# Patient Record
Sex: Male | Born: 1965 | Race: Black or African American | Hispanic: No | Marital: Married | Smoking: Never smoker
Health system: Southern US, Community
[De-identification: ages and names within clinical notes are randomized; demographics above are authoritative.]

## PROBLEM LIST (undated history)

## (undated) DIAGNOSIS — E119 Type 2 diabetes mellitus without complications: Secondary | ICD-10-CM

## (undated) DIAGNOSIS — I10 Essential (primary) hypertension: Secondary | ICD-10-CM

## (undated) HISTORY — DX: Type 2 diabetes mellitus without complications: E11.9

---

## 2007-04-08 ENCOUNTER — Ambulatory Visit: Payer: Self-pay | Admitting: Family Medicine

## 2007-04-10 ENCOUNTER — Ambulatory Visit (HOSPITAL_COMMUNITY): Admission: RE | Admit: 2007-04-10 | Discharge: 2007-04-10 | Payer: Self-pay | Admitting: Family Medicine

## 2008-05-24 IMAGING — CR DG ABDOMEN 2V
2 series · 2 of 2 positions shown · non-contrast
Comparison: None.

CLINICAL DATA: Bilateral lower abdominal pain for the past 2 weeks.

CHEST - 2 VIEW

[w abdomen upright]
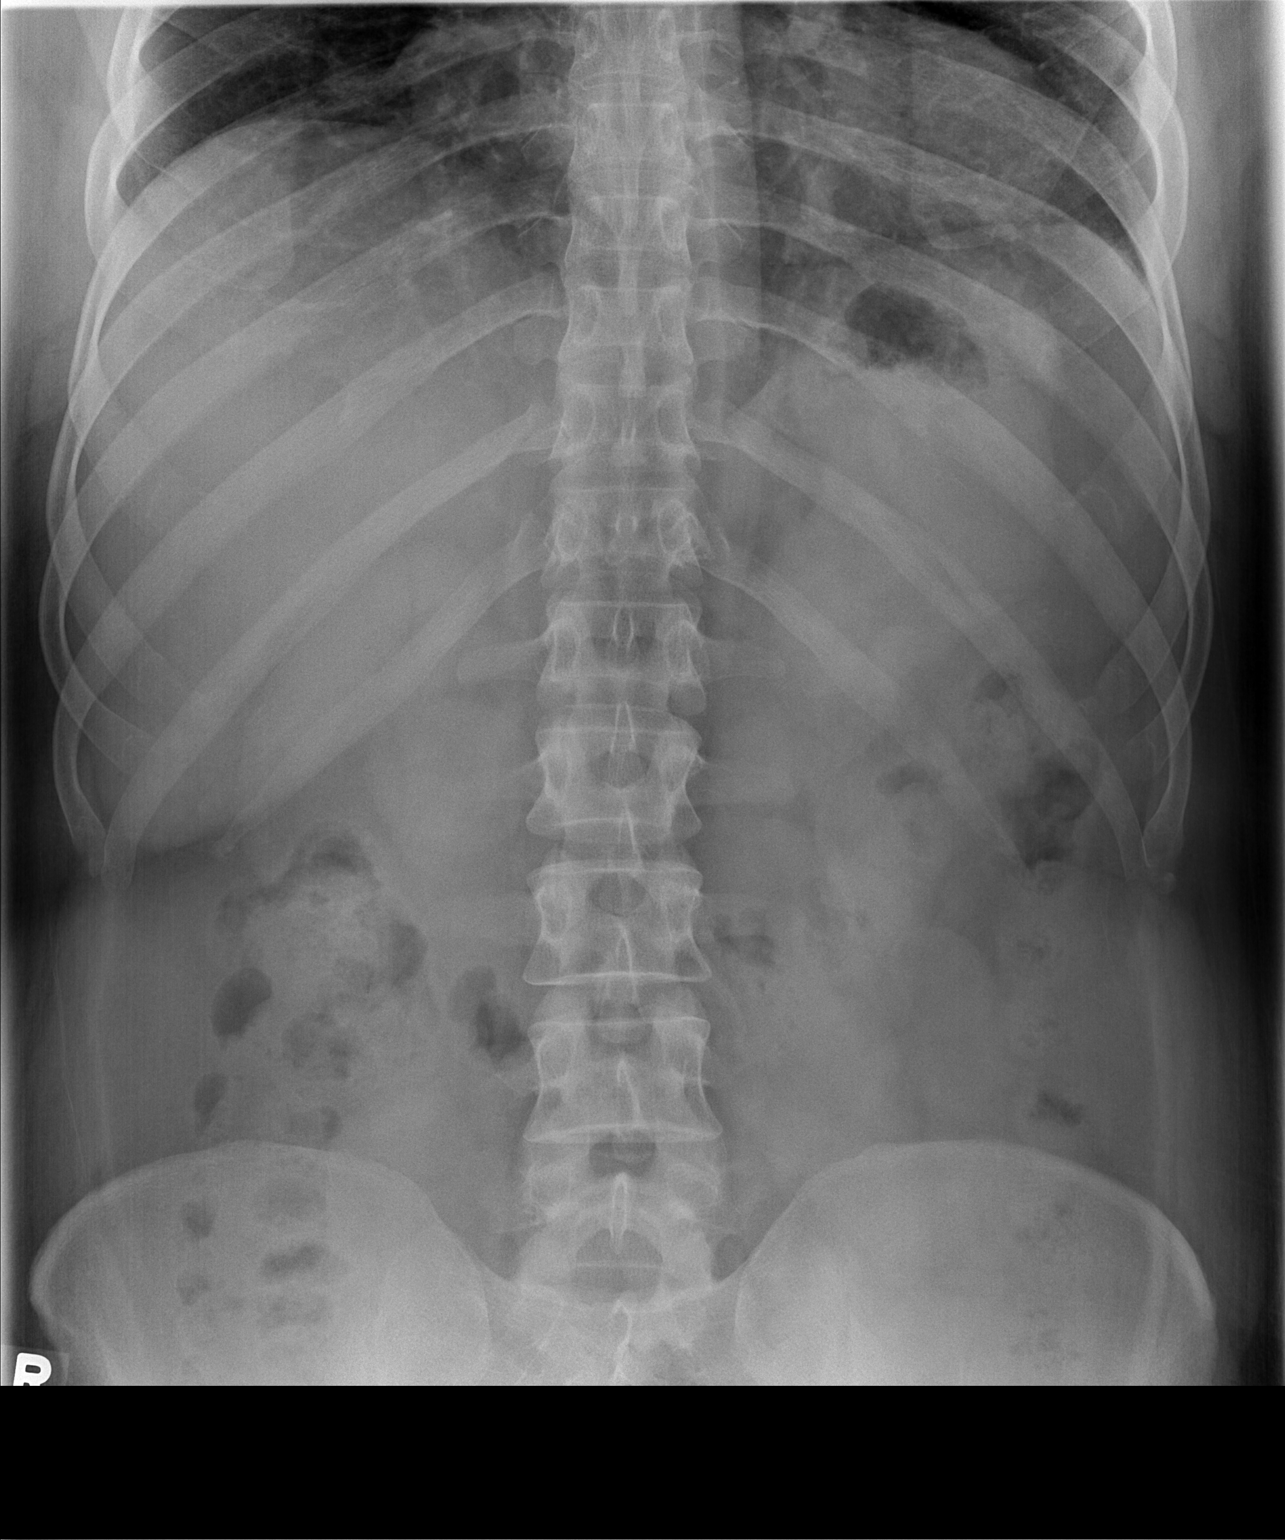

[t abdomen supine]
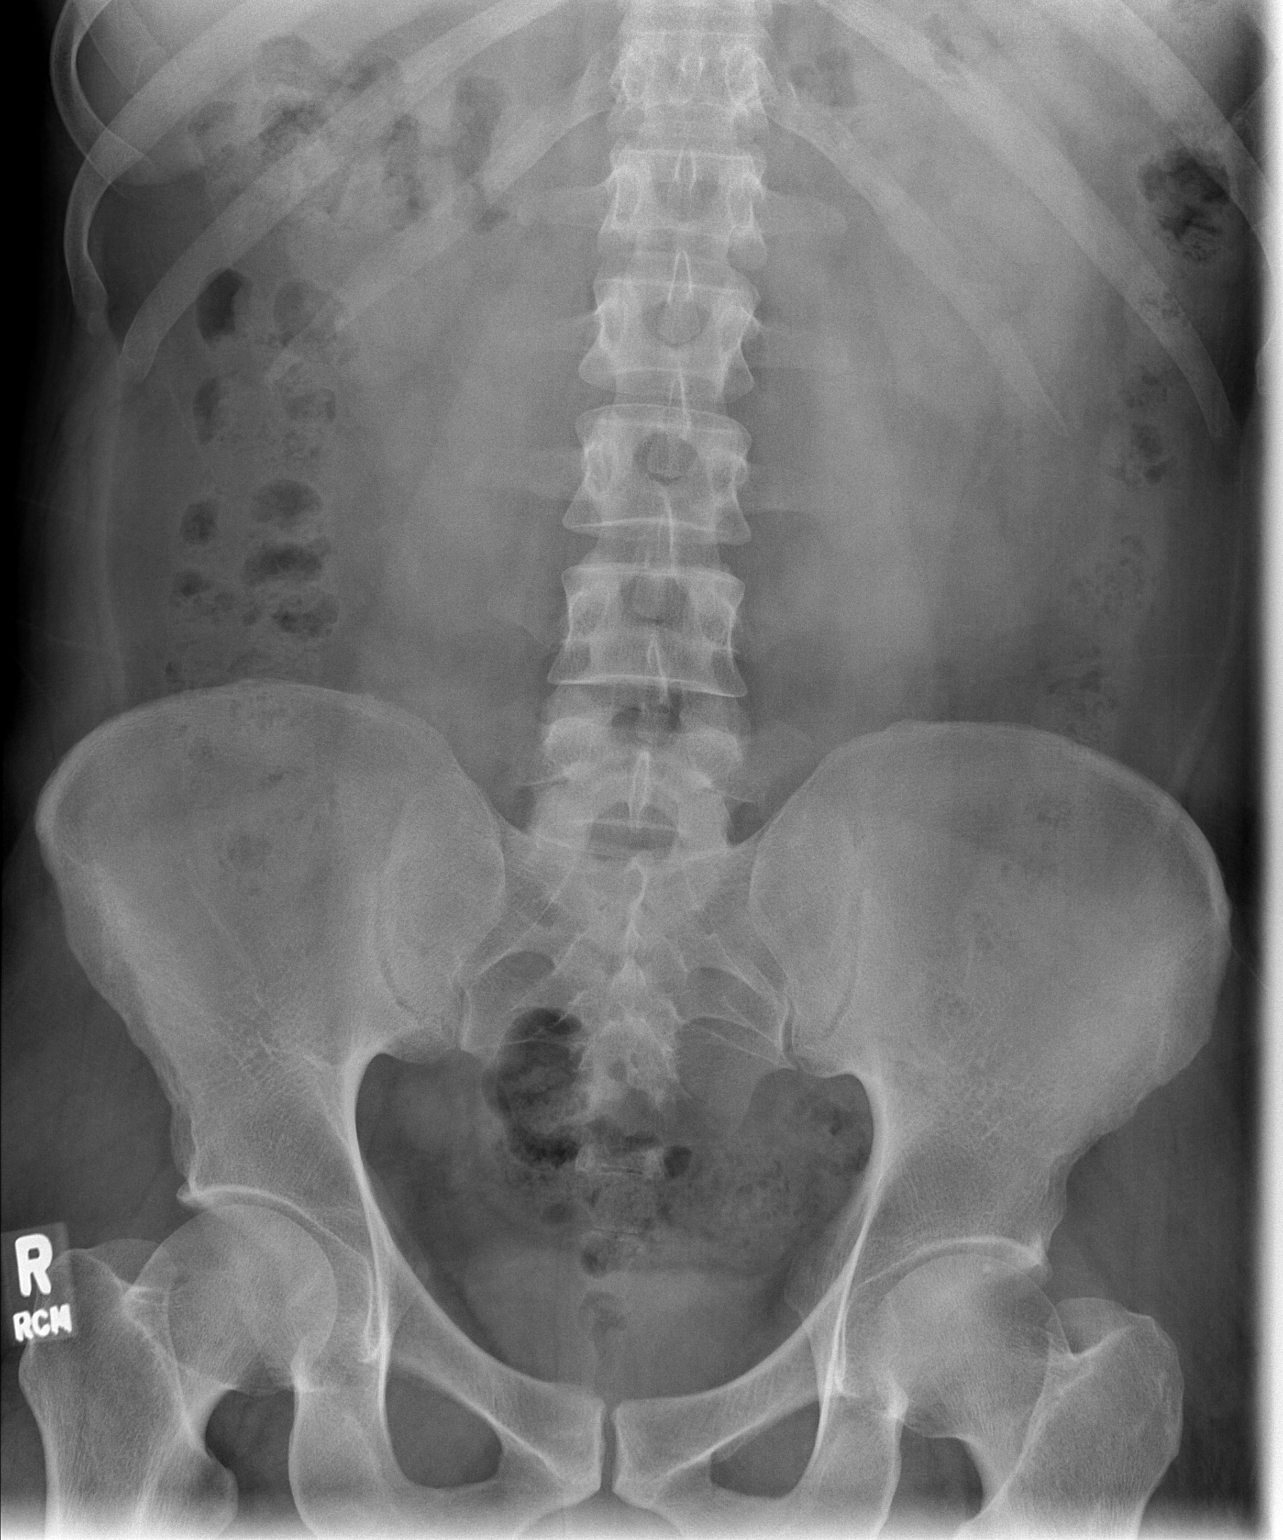

[2 of 2 positions shown; findings below may reference images not displayed]

FINDINGS: Normal bowel gas pattern with mildly prominent stool throughout the
colon. Unremarkable bones.

IMPRESSION

Mildly prominent stool throughout the colon.

## 2009-09-18 ENCOUNTER — Ambulatory Visit: Payer: Self-pay | Admitting: Family Medicine

## 2012-02-16 ENCOUNTER — Other Ambulatory Visit (HOSPITAL_COMMUNITY): Payer: Self-pay | Admitting: Family Medicine

## 2012-02-16 ENCOUNTER — Ambulatory Visit (HOSPITAL_COMMUNITY)
Admission: RE | Admit: 2012-02-16 | Discharge: 2012-02-16 | Disposition: A | Discharge status: Home / Self Care | Payer: Self-pay | Source: Ambulatory Visit | Attending: Family Medicine | Admitting: Family Medicine

## 2012-02-16 DIAGNOSIS — R52 Pain, unspecified: Secondary | ICD-10-CM

## 2012-02-16 DIAGNOSIS — R062 Wheezing: Secondary | ICD-10-CM | POA: Insufficient documentation

## 2012-02-16 DIAGNOSIS — R0602 Shortness of breath: Secondary | ICD-10-CM | POA: Insufficient documentation

## 2012-02-16 DIAGNOSIS — R079 Chest pain, unspecified: Secondary | ICD-10-CM | POA: Insufficient documentation

## 2014-08-15 ENCOUNTER — Ambulatory Visit (INDEPENDENT_AMBULATORY_CARE_PROVIDER_SITE_OTHER): Payer: Self-pay | Admitting: Family Medicine

## 2014-08-15 VITALS — BP 112/80 | HR 71 | Temp 98.2°F | Resp 16 | Ht 68.0 in | Wt 177.0 lb

## 2014-08-15 DIAGNOSIS — Z0289 Encounter for other administrative examinations: Secondary | ICD-10-CM

## 2014-08-15 DIAGNOSIS — Z008 Encounter for other general examination: Secondary | ICD-10-CM

## 2014-08-17 NOTE — Progress Notes (Signed)
Airline pilotCommercial Driver Medical Examination   Peter Ferrell- Peter ReveringSalih is a 48 y.o. male who presents today for a commercial driver fitness determination physical exam. The patient reports no problems. The following portions of the patient's history were reviewed and updated as appropriate: allergies, current medications, past family history, past medical history, past social history, past surgical history and problem list. Review of Systems A comprehensive review of systems was negative.   Objective:    Vision:  Uncorrected Corrected Horizontal Field of Vision  Right Eye 20/15 na 85 degrees  Left Eye  20/20 na 85 degrees  Both Eyes  20/15 na    Applicant can recognize and distinguish among traffic control signals and devices showing standard red, green, and amber colors.     Monocular Vision?: No   Hearing: Can hear forced whisper from 10 feet bilaterally     BP 112/80 mmHg  Pulse 71  Temp(Src) 98.2 F (36.8 C) (Oral)  Resp 16  Ht 5\' 8"  (1.727 m)  Wt 177 lb (80.287 kg)  BMI 26.92 kg/m2  SpO2 98%  General Appearance:    Alert, cooperative, no distress, appears stated age  Head:    Normocephalic, without obvious abnormality, atraumatic  Eyes:    PERRL, conjunctiva/corneas clear, EOM's intact, fundi    benign, both eyes       Ears:    Normal TM's and external ear canals, both ears  Nose:   Nares normal, septum midline, mucosa normal, no drainage    or sinus tenderness  Throat:   Lips, mucosa, and tongue normal; teeth and gums normal  Neck:   Supple, symmetrical, trachea midline, no adenopathy;       thyroid:  No enlargement/tenderness/nodules; no carotid   bruit or JVD  Back:     Symmetric, no curvature, ROM normal, no CVA tenderness  Lungs:     Clear to auscultation bilaterally, respirations unlabored  Chest wall:    No tenderness or deformity  Heart:    Regular rate and rhythm, S1 and S2 normal, no murmur, rub   or gallop  Abdomen:     Soft, non-tender, bowel sounds active all  four quadrants,    no masses, no organomegaly  Genitalia:    Normal male without lesion, discharge or tenderness     Extremities:   Extremities normal, atraumatic, no cyanosis or edema  Pulses:   2+ and symmetric all extremities  Skin:   Skin color, texture, turgor normal, no rashes or lesions  Lymph nodes:   Cervical, supraclavicular, and axillary nodes normal  Neurologic:   CNII-XII intact. Normal strength, sensation and reflexes      throughout    Labs: No results found for: SPECGRAV, PROTEINUR, BILIRUBINUR, GLUCOSEU   GS 1.020, 30+prot, neg bld, neg glucose  Assessment:    Healthy male exam.  Meets standards in 6349 CFR 391.41;  qualifies for 2 year certificate.    Plan:    Medical examiners certificate completed and printed. Return as needed.

## 2016-07-31 ENCOUNTER — Ambulatory Visit (INDEPENDENT_AMBULATORY_CARE_PROVIDER_SITE_OTHER): Payer: Self-pay | Admitting: Physician Assistant

## 2016-07-31 VITALS — BP 122/84 | HR 72 | Temp 98.3°F | Resp 17 | Ht 68.0 in | Wt 177.0 lb

## 2016-07-31 DIAGNOSIS — E119 Type 2 diabetes mellitus without complications: Secondary | ICD-10-CM

## 2016-07-31 DIAGNOSIS — Z0289 Encounter for other administrative examinations: Secondary | ICD-10-CM

## 2016-07-31 NOTE — Progress Notes (Signed)
By signing my name below, I, [name], attest that this documentation has been prepared under the direction and in the presence of [name], PA-C.  Electronically Signed: Arvilla MarketMesha Guinyard, Medical Scribe. 07/31/16. 2:29 PM.  Airline pilotCommercial Driver Medical Examination   [name]- [name] is a 50 y.o. male who presents today for a commercial driver fitness determination physical exam. The patient reports no problems today. In the past the patient reports receiving 1 year certificates. he denies focal neurological deficits, vision and hearing changes. He denies the habitual use of benzodiazepines, opioids, amphetamines, and narcotics. Pt does not smoke tobacco.  Past Medical History:  Diagnosis Date   Diabetes mellitus without complication (HCC)    DM: Takes metformin 500 mg. Is not complaining of sock and glove paresthesia, polydipsia or nocturia.   Current medications, family history, allergies, social history reviewed by me and exist elsewhere in the encounter.   Review of Systems  HENT: Negative for hearing loss.   Eyes: Negative for blurred vision and double vision.  Respiratory: Negative for shortness of breath.   Gastrointestinal: Negative for abdominal pain.  Neurological: Negative for focal weakness and seizures.  Psychiatric/Behavioral: Negative for depression. The patient is not nervous/anxious.     Objective:  BP 122/84 (BP Location: Right Arm, Patient Position: Sitting, Cuff Size: Normal)    Pulse 72    Temp 98.3 F (36.8 C) (Oral)    Resp 17    Ht 5\' 8"  (1.727 m)    Wt 177 lb (80.3 kg)    SpO2 99%    BMI 26.91 kg/m   Vision/hearing:  Visual Acuity Screening   Right eye Left eye Both eyes  Without correction: 20/20 20/20 20/15   With correction:       Applicant can recognize and distinguish among traffic control signals and devices showing standard red, green, and amber colors.  Corrective lenses required: No  Monocular Vision?: No  Hearing aid requirement:  No  Physical Exam  Constitutional: He appears well-developed and well-nourished. No distress.  HENT:  Head: Normocephalic and atraumatic.  Mouth/Throat: Oropharynx is clear and moist.  Dentition in good repair  Eyes: Conjunctivae are normal.  Neck: Neck supple.  Cardiovascular: Normal rate, regular rhythm and normal heart sounds.  Exam reveals no gallop and no friction rub.   No murmur heard. Pulses:      Radial pulses are 2+ on the right side, and 2+ on the left side.       Posterior tibial pulses are 2+ on the right side, and 2+ on the left side.  Radial, and PT pulses nl and intact  Pulmonary/Chest: Effort normal and breath sounds normal. No respiratory distress. He has no wheezes. He has no rales.  Abdominal: He exhibits no mass. There is no tenderness. There is no rebound.  Neurological: He is alert. He has normal strength. No cranial nerve deficit. He displays a negative Romberg sign.  Reflex Scores:      Tricep reflexes are 2+ on the right side and 2+ on the left side.      Bicep reflexes are 2+ on the right side and 2+ on the left side.      Brachioradialis reflexes are 2+ on the right side and 2+ on the left side.      Patellar reflexes are 2+ on the right side and 2+ on the left side.      Achilles reflexes are 2+ on the right side and 2+ on the left side. Skin: Skin is  warm and dry.  Psychiatric: He has a normal mood and affect. His behavior is normal.  Nursing note and vitals reviewed.   BP 122/84 (BP Location: Right Arm, Patient Position: Sitting, Cuff Size: Normal)    Pulse 72    Temp 98.3 F (36.8 C) (Oral)    Resp 17    Ht 5\' 8"  (1.727 m)    Wt 177 lb (80.3 kg)    SpO2 99%    BMI 26.91 kg/m   Labs:  [Urinalysis]: Sp Gr.: 1.025 / Protein: 100 / Blood: negative / Sugar: negative   Assessment:    Healthy male exam.  Meets standards, but periodic monitoring required due to DM.  Driver qualified only for 1 year.   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  There is no sugar in his urine.     Plan:    Medical examiners certificate completed and printed. Return as needed.     This note was scribed in my presence and I performed the services described in the this documentation.

## 2016-07-31 NOTE — Patient Instructions (Signed)
     IF you received an x-ray today, you will receive an invoice from [facility]. Please contact [facility] at [phone] with questions or concerns regarding your invoice.   IF you received labwork today, you will receive an invoice from [facility]. Please contact [facility] at [phone] with questions or concerns regarding your invoice.   Our billing staff will not be able to assist you with questions regarding bills from these companies.  You will be contacted with the lab results as soon as they are available. The fastest way to get your results is to activate your My Chart account. Instructions are located on the last page of this paperwork. If you have not heard from us regarding the results in 2 weeks, please contact this office.      

## 2017-05-20 ENCOUNTER — Ambulatory Visit (INDEPENDENT_AMBULATORY_CARE_PROVIDER_SITE_OTHER): Payer: Self-pay | Admitting: Physician Assistant

## 2017-05-26 ENCOUNTER — Ambulatory Visit (INDEPENDENT_AMBULATORY_CARE_PROVIDER_SITE_OTHER): Payer: Self-pay | Admitting: Physician Assistant

## 2022-01-27 ENCOUNTER — Encounter (HOSPITAL_COMMUNITY): Payer: Self-pay | Admitting: Emergency Medicine

## 2022-01-27 ENCOUNTER — Other Ambulatory Visit: Payer: Self-pay

## 2022-01-27 ENCOUNTER — Ambulatory Visit (HOSPITAL_COMMUNITY)
Admission: EM | Admit: 2022-01-27 | Discharge: 2022-01-27 | Disposition: A | Discharge status: Home / Self Care | Payer: 59

## 2022-01-27 DIAGNOSIS — J069 Acute upper respiratory infection, unspecified: Secondary | ICD-10-CM | POA: Diagnosis not present

## 2022-01-27 HISTORY — DX: Essential (primary) hypertension: I10

## 2022-01-27 MED ORDER — LISINOPRIL 20 MG PO TABS: 20.0000 mg | ORAL_TABLET | Freq: Every day | ORAL | 0 refills | Status: DC

## 2022-01-27 MED ORDER — METFORMIN HCL 500 MG PO TABS: 500.0000 mg | ORAL_TABLET | Freq: Two times a day (BID) | ORAL | 0 refills | Status: DC

## 2022-01-27 MED ORDER — AMOXICILLIN-POT CLAVULANATE 875-125 MG PO TABS: 1.0000 | ORAL_TABLET | Freq: Two times a day (BID) | ORAL | 0 refills | Status: DC

## 2022-01-27 NOTE — Discharge Instructions (Signed)
On exam your lungs were noted to be congested as your symptoms have been present for 7 days with no improvement, we will cover for bacteria and upper airway in the lungs ? ?Take Augmentin twice daily for the next 7 days ? ?You may continue use of any over-the-counter medications for additional comfort ?   ?You can take Tylenol and/or Ibuprofen as needed for fever reduction and pain relief. ?  ?For cough: honey 1/2 to 1 teaspoon (you can dilute the honey in water or another fluid).  You can also use guaifenesin and dextromethorphan for cough. You can use a humidifier for chest congestion and cough.  If you don't have a humidifier, you can sit in the bathroom with the hot shower running.    ?  ?For sore throat: try warm salt water gargles, cepacol lozenges, throat spray, warm tea or water with lemon/honey, popsicles or ice, or OTC cold relief medicine for throat discomfort. ?  ?For congestion: take a daily anti-histamine like Zyrtec, Claritin, and a oral decongestant, such as pseudoephedrine.  You can also use Flonase 1-2 sprays in each nostril daily. ?  ?It is important to stay hydrated: drink plenty of fluids (water, gatorade/powerade/pedialyte, juices, or teas) to keep your throat moisturized and help further relieve irritation/discomfort.  ?

## 2022-01-27 NOTE — ED Triage Notes (Signed)
Complains of cough and congestion for one week.  Patient is now noticing some wheezing in chest.  Denies fever.   ?

## 2022-01-27 NOTE — ED Provider Notes (Signed)
?[facility] ? ? ? ?CSN: [id number] ?Arrival date & time: 01/27/22  1257 ? ? ?  ? ?History   ?Chief Complaint ?Chief Complaint  ?Patient presents with  ? Cough  ? ? ?HPI ?Peter Ferrell is a 56 y.o. male.  ? ?Patient presents with chest congestion , nonproductive cough and wheezing for 7 days.  Wheezing is occurring predominantly at nighttime.  Following food and liquids.  Known sick contacts in household.  Has attempted use of allergy medicine and over-the-counter cold and flu which has been ineffective.  History of diabetes and hypertension .  Denies shortness of breath and chest tightness. ? ?Past Medical History:  ?Diagnosis Date  ? Diabetes mellitus without complication (HCC)   ? Hypertension   ? ? ?Patient Active Problem List  ? Diagnosis Date Noted  ? Diabetes (HCC) 07/31/2016  ? ? ?History reviewed. No pertinent surgical history. ? ? ? ? ?Home Medications   ? ?Prior to Admission medications   ?Medication Sig Start Date End Date Taking? Authorizing Provider  ?lisinopril (ZESTRIL) 20 MG tablet Take 20 mg by mouth daily. 10/28/21   [provider]  ?metFORMIN (GLUCOPHAGE) 500 MG tablet Take by mouth 2 (two) times daily with a meal.    [provider]  ? ? ?Family History ?History reviewed. No pertinent family history. ? ?Social History ?Social History  ? ?Tobacco Use  ? Smoking status: Never  ?Vaping Use  ? Vaping Use: Never used  ?Substance Use Topics  ? Alcohol use: Never  ? Drug use: Never  ? ? ? ?Allergies   ?Patient has no known allergies. ? ? ?Review of Systems ?Review of Systems  ?Constitutional: Negative.   ?HENT:  Positive for congestion. Negative for dental problem, drooling, ear discharge, ear pain, facial swelling, hearing loss, mouth sores, nosebleeds, postnasal drip, rhinorrhea, sinus pressure, sinus pain, sneezing, sore throat, tinnitus, trouble swallowing and voice change.   ?Respiratory:  Positive for cough and wheezing. Negative for apnea, choking, chest  tightness, shortness of breath and stridor.   ?Cardiovascular: Negative.   ?Gastrointestinal: Negative.   ?Skin: Negative.   ?Neurological: Negative.   ? ? ?Physical Exam ?Triage Vital Signs ?ED Triage Vitals  ?Enc Vitals Group  ?   BP 01/27/22 1351 (!) 149/73  ?   Pulse Rate 01/27/22 1351 74  ?   Resp 01/27/22 1351 18  ?   Temp 01/27/22 1351 98.1 ?F (36.7 ?C)  ?   Temp Source 01/27/22 1351 Oral  ?   SpO2 01/27/22 1351 97 %  ?   Weight --   ?   Height --   ?   Head Circumference --   ?   Peak Flow --   ?   Pain Score 01/27/22 1348 6  ?   Pain Loc --   ?   Pain Edu? --   ?   Excl. in GC? --   ? ?No data found. ? ?Updated Vital Signs ?BP (!) 149/73 (BP Location: Right Arm)   Pulse 74   Temp 98.1 ?F (36.7 ?C) (Oral)   Resp 18   SpO2 97%  ? ?Visual Acuity ?Right Eye Distance:   ?Left Eye Distance:   ?Bilateral Distance:   ? ?Right Eye Near:   ?Left Eye Near:    ?Bilateral Near:    ? ?Physical Exam ?Constitutional:   ?   Appearance: Normal appearance.  ?HENT:  ?   Head: Normocephalic.  ?   Right Ear: Tympanic membrane,  ear canal and external ear normal.  ?   Left Ear: Tympanic membrane, ear canal and external ear normal.  ?   Nose: Congestion present. No rhinorrhea.  ?   Mouth/Throat:  ?   Mouth: Mucous membranes are moist.  ?   Pharynx: Oropharynx is clear.  ?Eyes:  ?   Extraocular Movements: Extraocular movements intact.  ?Cardiovascular:  ?   Rate and Rhythm: Normal rate and regular rhythm.  ?   Pulses: Normal pulses.  ?   Heart sounds: Normal heart sounds.  ?Pulmonary:  ?   Effort: Pulmonary effort is normal.  ?   Breath sounds: Rhonchi present.  ?Musculoskeletal:  ?   Cervical back: Normal range of motion and neck supple.  ?Skin: ?   General: Skin is warm and dry.  ?Neurological:  ?   Mental Status: He is alert and oriented to person, place, and time. Mental status is at baseline.  ?Psychiatric:     ?   Mood and Affect: Mood normal.     ?   Behavior: Behavior normal.  ? ? ? ?UC Treatments / Results  ?Labs ?(all  labs ordered are listed, but only abnormal results are displayed) ?Labs Reviewed - No data to display ? ?EKG ? ? ?Radiology ?No results found. ? ?Procedures ?Procedures (including critical care time) ? ?Medications Ordered in UC ?Medications - No data to display ? ?Initial Impression / Assessment and Plan / UC Course  ?I have reviewed the triage vital signs and the nursing notes. ? ?Pertinent labs & imaging results that were available during my care of the patient were reviewed by me and considered in my medical decision making (see chart for details). ? ?Acute upper respiratory infection ? ?Vital signs are stable, O2 saturation 97% on room air, rhonchi heard to auscultation, will move forward for coverage for bronchitis and pneumonia without imaging, discussed with patient, Augmentin 7-day course prescribed, recommended use of over-the-counter medications for additional support, may follow-up with urgent care as needed for persisting or reoccurring symptoms ?Final Clinical Impressions(s) / UC Diagnoses  ? ?Final diagnoses:  ?None  ? ?Discharge Instructions   ?None ?  ? ?ED Prescriptions   ?None ?  ? ?PDMP not reviewed this encounter. ?  ?Valinda Hoar, NP ?01/27/22 1451 ? ?

## 2022-02-03 ENCOUNTER — Ambulatory Visit (HOSPITAL_COMMUNITY)
Admission: EM | Admit: 2022-02-03 | Discharge: 2022-02-03 | Disposition: A | Discharge status: Home / Self Care | Payer: 59 | Attending: Family Medicine | Admitting: Family Medicine

## 2022-02-03 ENCOUNTER — Ambulatory Visit (INDEPENDENT_AMBULATORY_CARE_PROVIDER_SITE_OTHER): Payer: 59

## 2022-02-03 ENCOUNTER — Encounter (HOSPITAL_COMMUNITY): Payer: Self-pay

## 2022-02-03 DIAGNOSIS — R059 Cough, unspecified: Secondary | ICD-10-CM | POA: Diagnosis not present

## 2022-02-03 DIAGNOSIS — R052 Subacute cough: Secondary | ICD-10-CM | POA: Diagnosis not present

## 2022-02-03 DIAGNOSIS — R062 Wheezing: Secondary | ICD-10-CM | POA: Diagnosis not present

## 2022-02-03 MED ORDER — BENZONATATE 100 MG PO CAPS: 100.0000 mg | ORAL_CAPSULE | Freq: Three times a day (TID) | ORAL | 0 refills | Status: DC

## 2022-02-03 MED ORDER — ALBUTEROL SULFATE HFA 108 (90 BASE) MCG/ACT IN AERS: 2.0000 | INHALATION_SPRAY | RESPIRATORY_TRACT | 0 refills | Status: AC | PRN

## 2022-02-03 NOTE — ED Triage Notes (Signed)
Pt states he was seen here on 01/27/2022 for cough and congestion. Pt states his symptoms aren't getting any better.   ?

## 2022-02-03 NOTE — Discharge Instructions (Addendum)
You were seen today for continued cough,.  ?Your xray was normal today.  ?I have sent out an inhaler for you as I did hear wheezing on exam, as well as a medication to help with the cough.  You may have some left over airway irritation from the infection you had.  I see no need to repeat an antibiotic at this time.   ?If you have continued symptoms then please return for re-evaluation.  ? ?

## 2022-02-03 NOTE — ED Provider Notes (Signed)
?[facility] ? ? ? ?CSN: [id number] ?Arrival date & time: 02/03/22  1335 ? ? ?  ? ?History   ?Chief Complaint ?Chief Complaint  ?Patient presents with  ? Cough  ? Follow-up  ? ? ?HPI ?Peter Ferrell is a 56 y.o. male.  ? ?Patient is here for continued cough and congestion.  ?He was seen 1 week ago for cough, congestion.  Given augmentin to take bid, and yesterday was his last day.  ?He continues with cough, throat congestion, feels like he has to cough it up.  No sinus congestion or drainage.  ?Symptoms now for 3 weeks.  ?No wheezing or sob.  ?No other mediations used.  ? ?Past Medical History:  ?Diagnosis Date  ? Diabetes mellitus without complication (HCC)   ? Hypertension   ? ? ?Patient Active Problem List  ? Diagnosis Date Noted  ? Diabetes (HCC) 07/31/2016  ? ? ?History reviewed. No pertinent surgical history. ? ? ? ? ?Home Medications   ? ?Prior to Admission medications   ?Medication Sig Start Date End Date Taking? Authorizing Provider  ?amoxicillin-clavulanate (AUGMENTIN) 875-125 MG tablet Take 1 tablet by mouth every 12 (twelve) hours. 01/27/22   Valinda Hoar, NP  ?lisinopril (ZESTRIL) 20 MG tablet Take 1 tablet (20 mg total) by mouth daily. 01/27/22   Valinda Hoar, NP  ?metFORMIN (GLUCOPHAGE) 500 MG tablet Take 1 tablet (500 mg total) by mouth 2 (two) times daily with a meal. 01/27/22 02/26/22  Valinda Hoar, NP  ? ? ?Family History ?History reviewed. No pertinent family history. ? ?Social History ?Social History  ? ?Tobacco Use  ? Smoking status: Never  ?Vaping Use  ? Vaping Use: Never used  ?Substance Use Topics  ? Alcohol use: Never  ? Drug use: Never  ? ? ? ?Allergies   ?Patient has no known allergies. ? ? ?Review of Systems ?Review of Systems  ?Constitutional:  Negative for chills and fever.  ?HENT: Negative.    ?Respiratory:  Positive for cough.   ?Cardiovascular: Negative.   ?Gastrointestinal: Negative.   ?Genitourinary: Negative.   ?Musculoskeletal: Negative.   ? ? ?Physical  Exam ?Triage Vital Signs ?ED Triage Vitals  ?Enc Vitals Group  ?   BP 02/03/22 1457 (!) 142/83  ?   Pulse Rate 02/03/22 1457 65  ?   Resp 02/03/22 1457 18  ?   Temp 02/03/22 1457 98.2 ?F (36.8 ?C)  ?   Temp Source 02/03/22 1457 Oral  ?   SpO2 02/03/22 1457 95 %  ?   Weight --   ?   Height --   ?   Head Circumference --   ?   Peak Flow --   ?   Pain Score 02/03/22 1454 2  ?   Pain Loc --   ?   Pain Edu? --   ?   Excl. in GC? --   ? ?No data found. ? ?Updated Vital Signs ?BP (!) 142/83 (BP Location: Right Arm)   Pulse 65   Temp 98.2 ?F (36.8 ?C) (Oral)   Resp 18   SpO2 95%  ? ?Visual Acuity ?Right Eye Distance:   ?Left Eye Distance:   ?Bilateral Distance:   ? ?Right Eye Near:   ?Left Eye Near:    ?Bilateral Near:    ? ?Physical Exam ?Constitutional:   ?   Appearance: Normal appearance.  ?HENT:  ?   Head: Normocephalic and atraumatic.  ?   Mouth/Throat:  ?  Mouth: Mucous membranes are moist.  ?   Pharynx: No posterior oropharyngeal erythema.  ?Cardiovascular:  ?   Rate and Rhythm: Normal rate and regular rhythm.  ?Pulmonary:  ?   Effort: Pulmonary effort is normal.  ?   Breath sounds: Wheezing and rhonchi present.  ?   Comments: Very mild wheezing ?Musculoskeletal:  ?   Cervical back: Normal range of motion and neck supple. No tenderness.  ?Skin: ?   General: Skin is warm.  ?Neurological:  ?   General: No focal deficit present.  ?   Mental Status: He is alert.  ?Psychiatric:     ?   Mood and Affect: Mood normal.  ? ? ? ?UC Treatments / Results  ?Labs ?(all labs ordered are listed, but only abnormal results are displayed) ?Labs Reviewed - No data to display ? ?EKG ? ? ?Radiology ?DG Chest 2 View ? ?Result Date: 02/03/2022 ?CLINICAL DATA:  Cough and wheezing. Persistent symptoms over 1 week. EXAM: CHEST - 2 VIEW COMPARISON:  Radiographs 02/16/2012. FINDINGS: The heart size and mediastinal contours are stable. Stable mild linear scarring or atelectasis in the right middle lobe and lingula. No edema, confluent airspace  opacity, pleural effusion or pneumothorax. The bones appear unremarkable. IMPRESSION: Stable chest.  No evidence of active cardiopulmonary process. Electronically Signed   By: Carey Bullocks M.D.   On: 02/03/2022 15:40   ? ?Procedures ?Procedures (including critical care time) ? ?Medications Ordered in UC ?Medications - No data to display ? ?Initial Impression / Assessment and Plan / UC Course  ?I have reviewed the triage vital signs and the nursing notes. ? ?Pertinent labs & imaging results that were available during my care of the patient were reviewed by me and considered in my medical decision making (see chart for details). ? ?  ? ?Final Clinical Impressions(s) / UC Diagnoses  ? ?Final diagnoses:  ?Subacute cough  ?Wheezing  ? ? ? ?Discharge Instructions   ? ?  ?You were seen today for continued cough,.  ?Your xray was normal today.  ?I have sent out an inhaler for you as I did hear wheezing on exam, as well as a medication to help with the cough.  You may have some left over airway irritation from the infection you had.  I see no need to repeat an antibiotic at this time.   ?If you have continued symptoms then please return for re-evaluation.  ? ? ? ? ?ED Prescriptions   ? ? Medication Sig Dispense Auth. Provider  ? albuterol (VENTOLIN HFA) 108 (90 Base) MCG/ACT inhaler Inhale 2 puffs into the lungs every 4 (four) hours as needed for wheezing or shortness of breath. 1 each Jannifer Franklin, MD  ? benzonatate (TESSALON) 100 MG capsule Take 1 capsule (100 mg total) by mouth every 8 (eight) hours. 21 capsule Jannifer Franklin, MD  ? ?  ? ?PDMP not reviewed this encounter. ?  Jannifer Franklin, MD ?02/03/22 1547 ? ?

## 2022-05-02 ENCOUNTER — Ambulatory Visit (HOSPITAL_COMMUNITY)
Admission: EM | Admit: 2022-05-02 | Discharge: 2022-05-02 | Disposition: A | Discharge status: Home / Self Care | Payer: 59 | Attending: Emergency Medicine | Admitting: Emergency Medicine

## 2022-05-02 ENCOUNTER — Encounter (HOSPITAL_COMMUNITY): Payer: Self-pay | Admitting: Emergency Medicine

## 2022-05-02 DIAGNOSIS — I10 Essential (primary) hypertension: Secondary | ICD-10-CM | POA: Diagnosis not present

## 2022-05-02 DIAGNOSIS — E119 Type 2 diabetes mellitus without complications: Secondary | ICD-10-CM | POA: Diagnosis not present

## 2022-05-02 DIAGNOSIS — Z760 Encounter for issue of repeat prescription: Secondary | ICD-10-CM

## 2022-05-02 DIAGNOSIS — Z7984 Long term (current) use of oral hypoglycemic drugs: Secondary | ICD-10-CM | POA: Diagnosis not present

## 2022-05-02 DIAGNOSIS — Z79899 Other long term (current) drug therapy: Secondary | ICD-10-CM

## 2022-05-02 MED ORDER — LISINOPRIL 20 MG PO TABS: 20.0000 mg | ORAL_TABLET | Freq: Every day | ORAL | 0 refills | Status: AC

## 2022-05-02 MED ORDER — METFORMIN HCL 500 MG PO TABS: 500.0000 mg | ORAL_TABLET | Freq: Two times a day (BID) | ORAL | 0 refills | Status: AC

## 2022-05-02 NOTE — ED Provider Notes (Signed)
[facility]    CSN: [id number] Arrival date & time: 05/02/22  1616      History   Chief Complaint Chief Complaint  Patient presents with   Medication Refill    HPI Peter Ferrell is a 56 y.o. male.  Presents for medication refill.  Reports he ran out of his lisinopril 2 days ago.  Has doctors appointment scheduled for August 30. Blood pressures checked at home have been normal for him. Denies any symptoms. Does not have headache, vision changes, dizziness or blurry vision, chest pain or shortness of breath, abdominal pain.  History of diabetes, reports last A1c was 5 something.  Does not really check his sugars at home but he takes his medicine as prescribed.  Past Medical History:  Diagnosis Date   Diabetes mellitus without complication (HCC)    Hypertension     Patient Active Problem List   Diagnosis Date Noted   Diabetes (HCC) 07/31/2016    History reviewed. No pertinent surgical history.     Home Medications    Prior to Admission medications   Medication Sig Start Date End Date Taking? Authorizing Provider  albuterol (VENTOLIN HFA) 108 (90 Base) MCG/ACT inhaler Inhale 2 puffs into the lungs every 4 (four) hours as needed for wheezing or shortness of breath. 02/03/22   Piontek, Denny Peon, MD  lisinopril (ZESTRIL) 20 MG tablet Take 1 tablet (20 mg total) by mouth daily. 05/02/22   Peter Ferrell, Peter Joiner, PA-C  metFORMIN (GLUCOPHAGE) 500 MG tablet Take 1 tablet (500 mg total) by mouth 2 (two) times daily with a meal. 05/02/22 06/01/22  Audryana Hockenberry, Ray Church    Family History No family history on file.  Social History Social History   Tobacco Use   Smoking status: Never  Vaping Use   Vaping Use: Never used  Substance Use Topics   Alcohol use: Never   Drug use: Never     Allergies   Patient has no known allergies.   Review of Systems Review of Systems Per HPI  Physical Exam Triage Vital Signs ED Triage Vitals  Enc Vitals Group     BP 05/02/22  1649 (!) 148/70     Pulse Rate 05/02/22 1649 64     Resp 05/02/22 1649 17     Temp 05/02/22 1649 98 F (36.7 C)     Temp Source 05/02/22 1649 Oral     SpO2 05/02/22 1649 100 %     Weight --      Height --      Head Circumference --      Peak Flow --      Pain Score 05/02/22 1648 0     Pain Loc --      Pain Edu? --      Excl. in GC? --    No data found.  Updated Vital Signs BP (!) 148/70 (BP Location: Left Arm)   Pulse 64   Temp 98 F (36.7 C) (Oral)   Resp 17   SpO2 100%    Physical Exam Vitals and nursing note reviewed.  Constitutional:      General: He is not in acute distress.    Appearance: Normal appearance.  HENT:     Mouth/Throat:     Pharynx: Oropharynx is clear.  Eyes:     Conjunctiva/sclera: Conjunctivae normal.  Cardiovascular:     Rate and Rhythm: Normal rate and regular rhythm.     Heart sounds: Normal heart sounds.  Pulmonary:     Effort:  Pulmonary effort is normal.     Breath sounds: Normal breath sounds.  Skin:    General: Skin is warm and dry.  Neurological:     General: No focal deficit present.     Mental Status: He is alert and oriented to person, place, and time.      UC Treatments / Results  Labs (all labs ordered are listed, but only abnormal results are displayed) Labs Reviewed - No data to display  EKG  Radiology No results found.  Procedures Procedures   Medications Ordered in UC Medications - No data to display  Initial Impression / Assessment and Plan / UC Course  I have reviewed the triage vital signs and the nursing notes.  Pertinent labs & imaging results that were available during my care of the patient were reviewed by me and considered in my medical decision making (see chart for details).  Refilled 30-day supply lisinopril.  Also refilled metformin.  Discussed with patient to monitor blood pressure and blood sugar at home.  Emergency department if he ever has abnormal numbers and symptoms. We follow-up with  his primary care at his appointment in August.  Final Clinical Impressions(s) / UC Diagnoses   Final diagnoses:  Medication refill     Discharge Instructions      Please take your medicine as prescribed. I have refilled your lisinopril and metformin.  Make sure you follow up with your primary care provider on 8/30.    ED Prescriptions     Medication Sig Dispense Auth. Provider   lisinopril (ZESTRIL) 20 MG tablet Take 1 tablet (20 mg total) by mouth daily. 30 tablet Tracker Mance, PA-C   metFORMIN (GLUCOPHAGE) 500 MG tablet Take 1 tablet (500 mg total) by mouth 2 (two) times daily with a meal. 60 tablet Sou Nohr, Peter Joiner, PA-C      PDMP not reviewed this encounter.   Faruq Rosenberger, Ray Church 05/02/22 1717

## 2022-05-02 NOTE — ED Triage Notes (Signed)
Reports out of Lisinopril for 2 days and needing refill. Has DR appt 8/30.

## 2022-05-02 NOTE — Discharge Instructions (Addendum)
Please take your medicine as prescribed. I have refilled your lisinopril and metformin.  Make sure you follow up with your primary care provider on 8/30.
